# Patient Record
Sex: Female | Born: 1970 | Race: Black or African American | Hispanic: No | Marital: Married
Health system: Southern US, Community
[De-identification: ages and names within clinical notes are randomized; demographics above are authoritative.]

---

## 1999-08-06 ENCOUNTER — Other Ambulatory Visit: Admission: RE | Admit: 1999-08-06 | Discharge: 1999-08-06 | Payer: Self-pay | Admitting: Family Medicine

## 2008-10-05 ENCOUNTER — Ambulatory Visit: Payer: Self-pay | Admitting: Family Medicine

## 2009-12-17 ENCOUNTER — Ambulatory Visit: Payer: Self-pay | Admitting: Family Medicine

## 2010-03-26 ENCOUNTER — Ambulatory Visit: Payer: Self-pay | Admitting: Gastroenterology

## 2010-03-28 LAB — PATHOLOGY REPORT

## 2011-01-23 ENCOUNTER — Ambulatory Visit: Payer: Self-pay | Admitting: Family Medicine

## 2012-03-23 ENCOUNTER — Ambulatory Visit: Payer: Self-pay | Admitting: Family Medicine

## 2013-03-24 ENCOUNTER — Ambulatory Visit: Payer: Self-pay | Admitting: Family Medicine

## 2014-08-22 ENCOUNTER — Ambulatory Visit: Payer: Self-pay | Admitting: Family Medicine

## 2017-06-18 ENCOUNTER — Other Ambulatory Visit: Payer: Self-pay | Admitting: Family Medicine

## 2017-06-18 DIAGNOSIS — Z1231 Encounter for screening mammogram for malignant neoplasm of breast: Secondary | ICD-10-CM

## 2018-05-18 ENCOUNTER — Other Ambulatory Visit: Payer: Self-pay | Admitting: Internal Medicine

## 2018-05-18 DIAGNOSIS — Z1231 Encounter for screening mammogram for malignant neoplasm of breast: Secondary | ICD-10-CM

## 2018-06-02 ENCOUNTER — Inpatient Hospital Stay: Admission: RE | Admit: 2018-06-02 | Payer: Self-pay | Source: Ambulatory Visit

## 2018-06-21 ENCOUNTER — Inpatient Hospital Stay: Admission: RE | Admit: 2018-06-21 | Payer: Self-pay | Source: Ambulatory Visit

## 2018-06-23 ENCOUNTER — Inpatient Hospital Stay: Admission: RE | Admit: 2018-06-23 | Payer: Self-pay | Source: Ambulatory Visit

## 2018-06-24 ENCOUNTER — Ambulatory Visit: Payer: Self-pay

## 2018-06-29 ENCOUNTER — Ambulatory Visit
Admission: RE | Admit: 2018-06-29 | Discharge: 2018-06-29 | Disposition: A | Discharge status: Home / Self Care | Payer: BC Managed Care – PPO | Source: Ambulatory Visit | Attending: Internal Medicine | Admitting: Internal Medicine

## 2018-06-29 ENCOUNTER — Encounter (INDEPENDENT_AMBULATORY_CARE_PROVIDER_SITE_OTHER): Payer: Self-pay

## 2018-06-29 DIAGNOSIS — Z1231 Encounter for screening mammogram for malignant neoplasm of breast: Secondary | ICD-10-CM | POA: Insufficient documentation

## 2019-08-11 ENCOUNTER — Other Ambulatory Visit: Payer: Self-pay | Admitting: Internal Medicine

## 2019-08-11 DIAGNOSIS — Z1231 Encounter for screening mammogram for malignant neoplasm of breast: Secondary | ICD-10-CM

## 2019-08-16 ENCOUNTER — Other Ambulatory Visit: Payer: Self-pay

## 2019-08-16 ENCOUNTER — Ambulatory Visit
Admission: RE | Admit: 2019-08-16 | Discharge: 2019-08-16 | Disposition: A | Discharge status: Home / Self Care | Payer: BC Managed Care – PPO | Source: Ambulatory Visit | Attending: Internal Medicine | Admitting: Internal Medicine

## 2019-08-16 DIAGNOSIS — Z1231 Encounter for screening mammogram for malignant neoplasm of breast: Secondary | ICD-10-CM | POA: Diagnosis present

## 2020-08-06 ENCOUNTER — Other Ambulatory Visit: Payer: Self-pay | Admitting: Internal Medicine

## 2020-08-06 DIAGNOSIS — Z1231 Encounter for screening mammogram for malignant neoplasm of breast: Secondary | ICD-10-CM

## 2020-08-10 ENCOUNTER — Ambulatory Visit: Payer: BC Managed Care – PPO

## 2020-08-16 ENCOUNTER — Other Ambulatory Visit: Payer: Self-pay

## 2020-08-16 ENCOUNTER — Ambulatory Visit
Admission: RE | Admit: 2020-08-16 | Discharge: 2020-08-16 | Disposition: A | Discharge status: Home / Self Care | Payer: BC Managed Care – PPO | Source: Ambulatory Visit | Attending: Internal Medicine | Admitting: Internal Medicine

## 2020-08-16 DIAGNOSIS — Z1231 Encounter for screening mammogram for malignant neoplasm of breast: Secondary | ICD-10-CM

## 2021-07-03 IMAGING — MG DIGITAL SCREENING BILAT W/ TOMO W/ CAD
8 series · 9 of 24 positions shown · non-contrast
Comparison: Previous exam(s).

CLINICAL DATA: Screening.

EXAM:
DIGITAL SCREENING BILATERAL MAMMOGRAM WITH TOMO AND CAD

[R CC synth-2D]
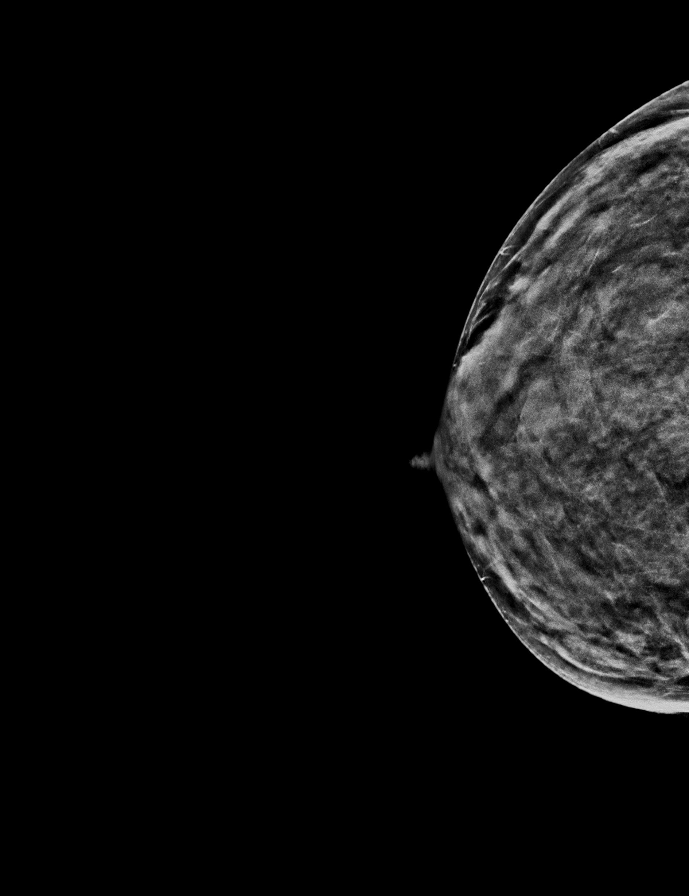

[R MLO synth-2D]
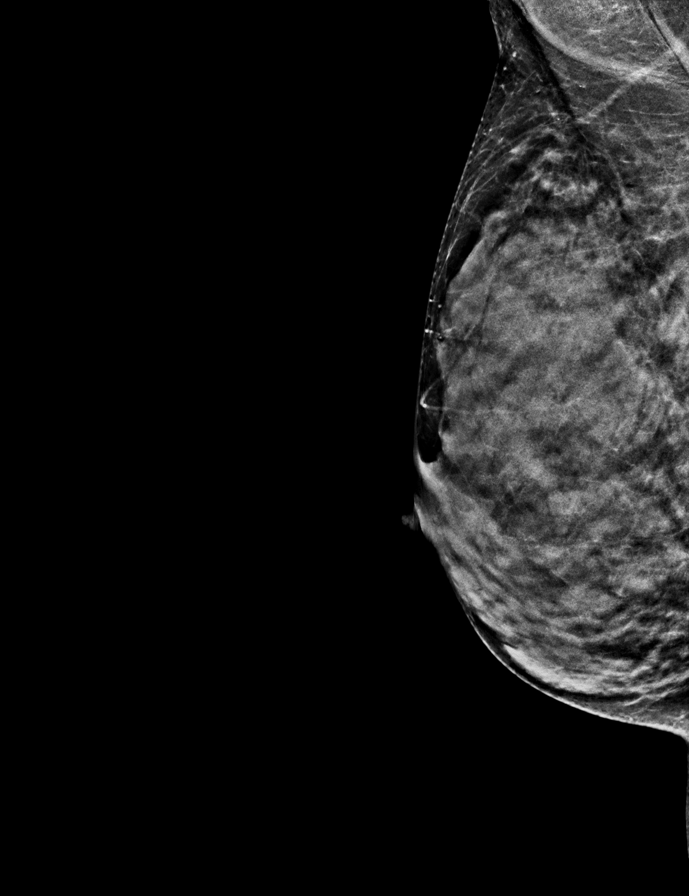

[L CC synth-2D]
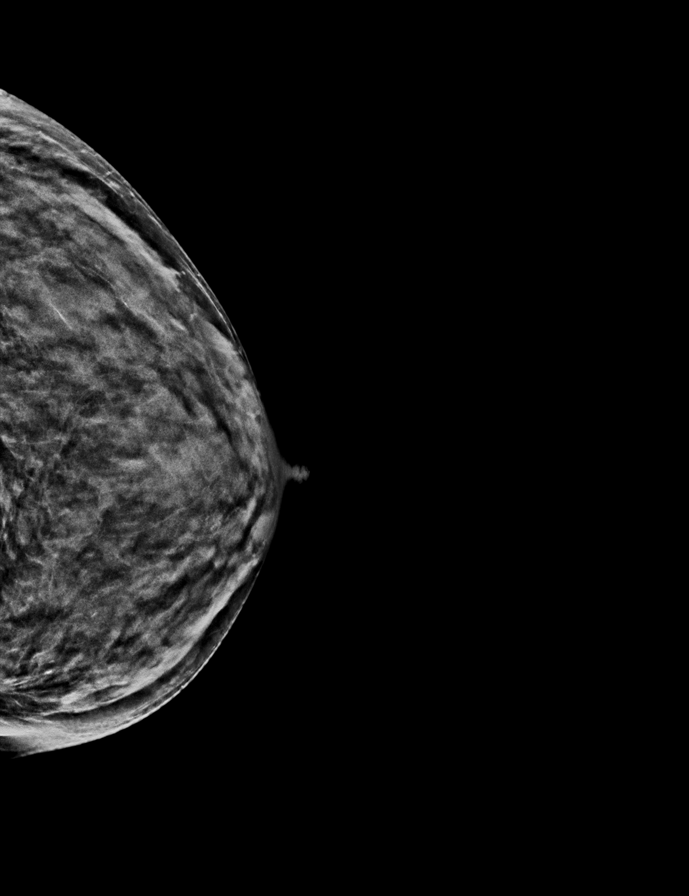

[L MLO synth-2D]
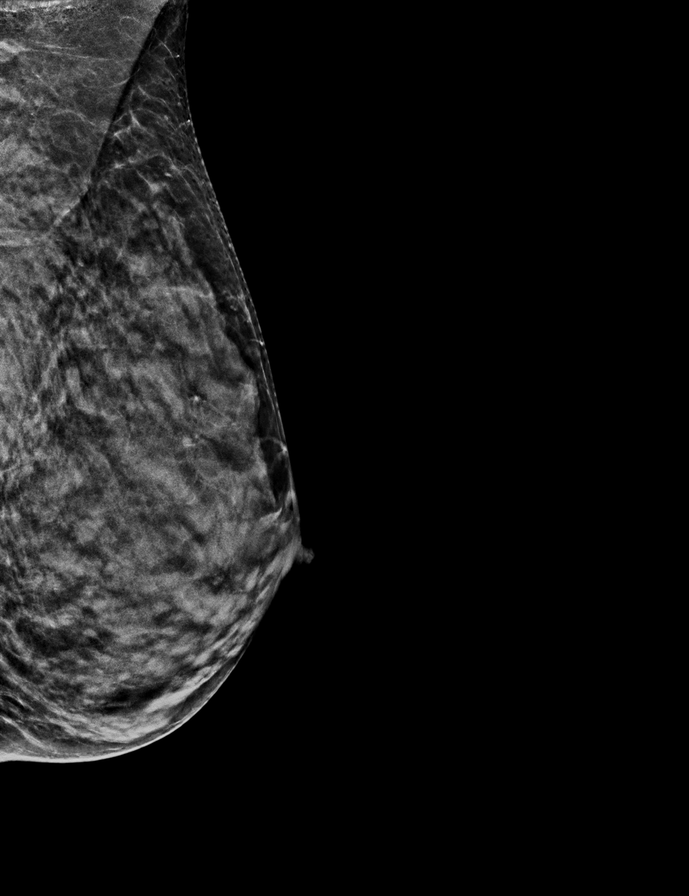

[L CC tomo · 2 of 45 frames shown]
[frame 15/45]
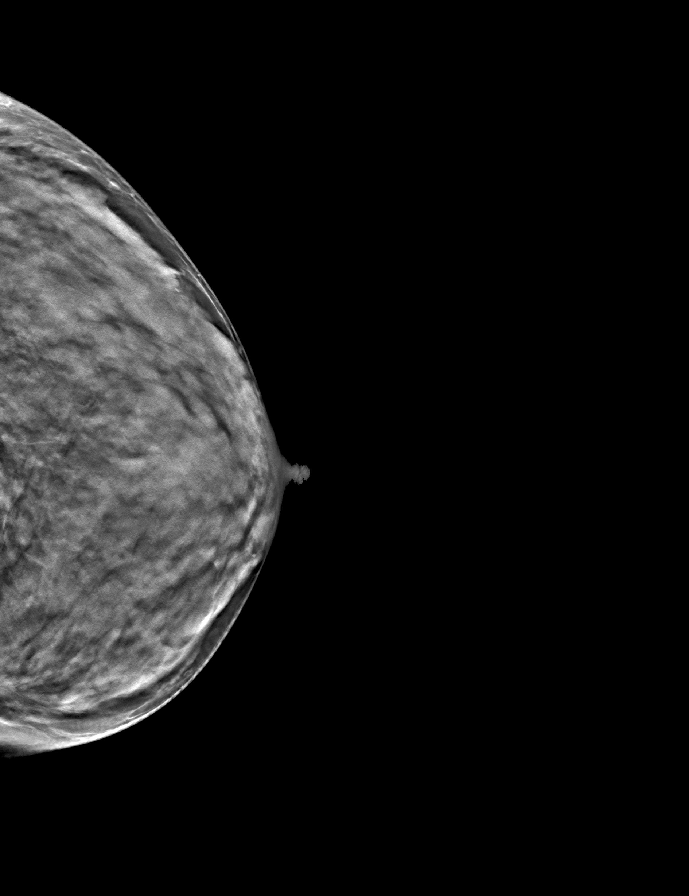
[frame 23/45]
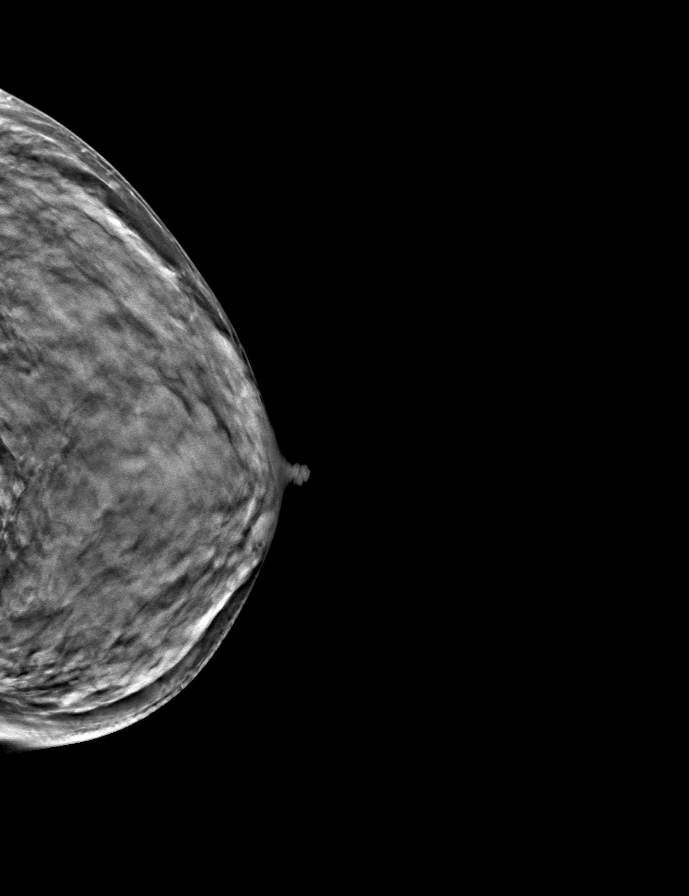

[R CC tomo · tomo slice 23/46.0]
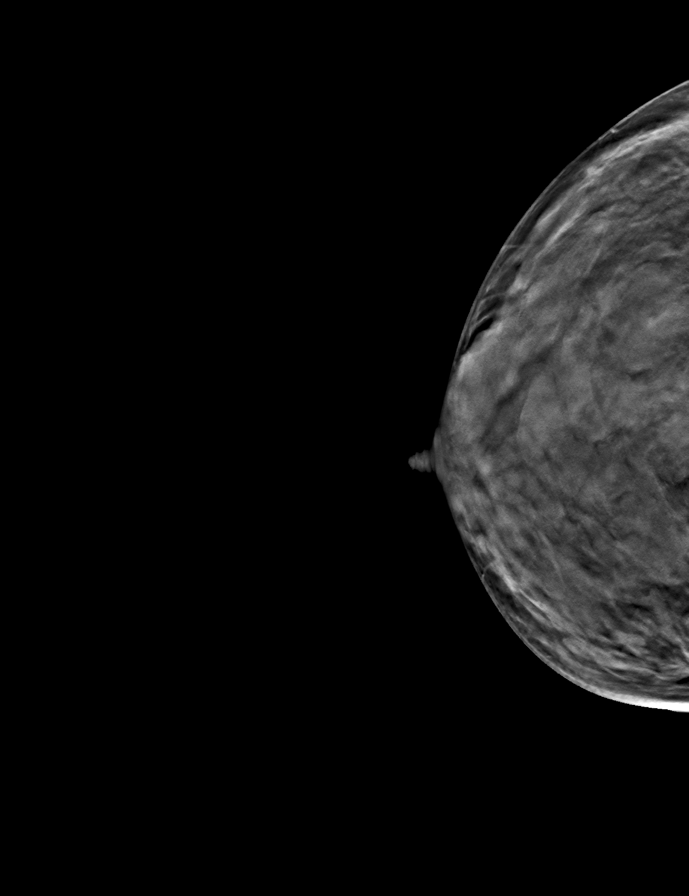

[L MLO tomo · tomo slice 23/46.0]
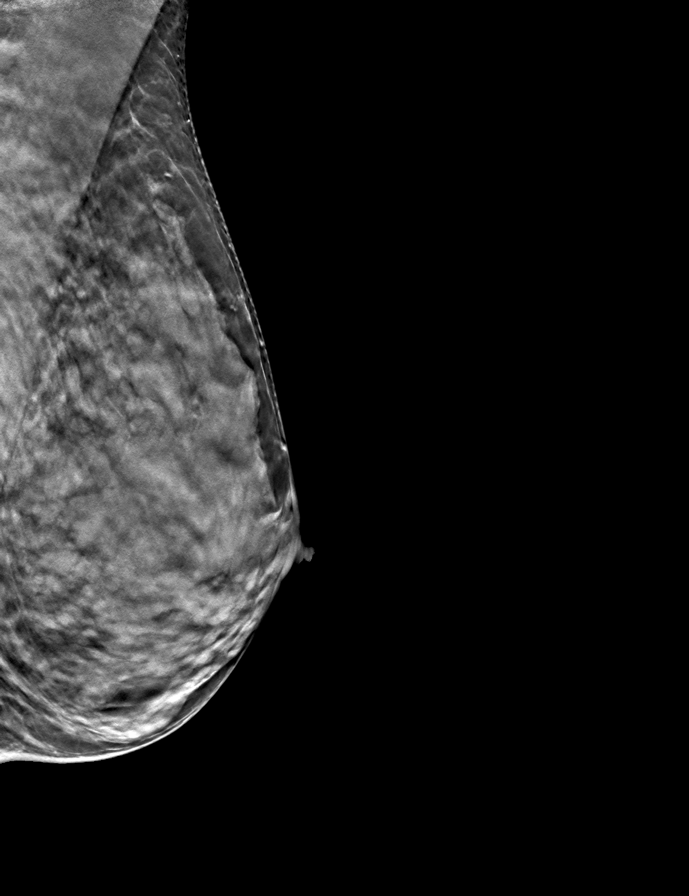

[R MLO tomo · tomo slice 23/46.0]
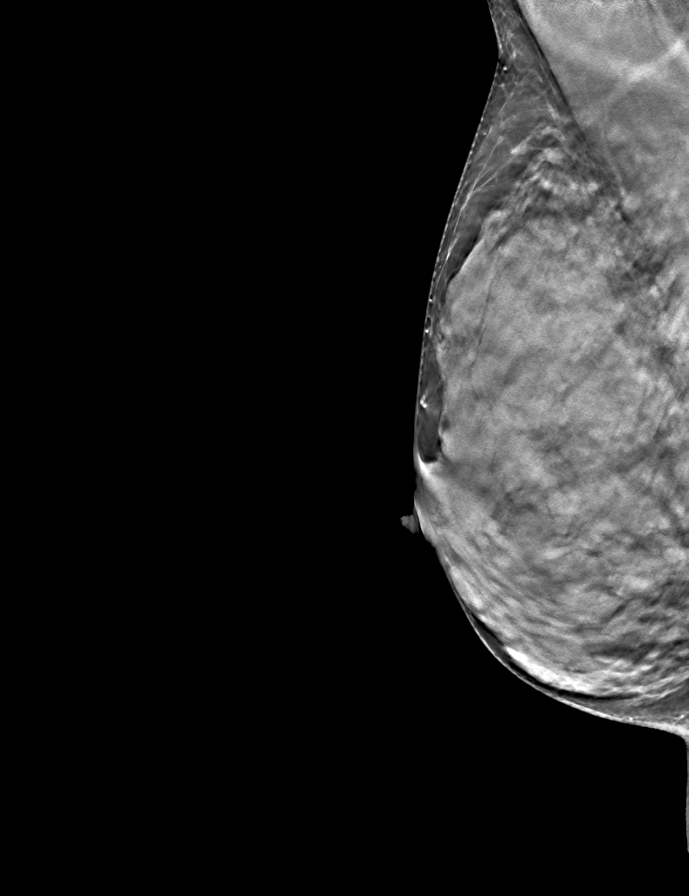

[9 of 24 positions shown; findings below may reference images not displayed]

ACR Breast Density Category d: The breast tissue is extremely dense,
which lowers the sensitivity of mammography
FINDINGS: There are no findings suspicious for malignancy. Images were
processed with CAD.
IMPRESSION: No mammographic evidence of malignancy. A result letter of this
screening mammogram will be mailed directly to the patient.

RECOMMENDATION:
Screening mammogram in one year. (Code:WO-0-ZI0)

BI-RADS CATEGORY  1: Negative.

## 2021-12-23 ENCOUNTER — Other Ambulatory Visit: Payer: Self-pay | Admitting: Nurse Practitioner

## 2021-12-23 DIAGNOSIS — Z1231 Encounter for screening mammogram for malignant neoplasm of breast: Secondary | ICD-10-CM

## 2022-01-22 ENCOUNTER — Ambulatory Visit
Admission: RE | Admit: 2022-01-22 | Discharge: 2022-01-22 | Disposition: A | Discharge status: Home / Self Care | Payer: BC Managed Care – PPO | Source: Ambulatory Visit | Attending: Nurse Practitioner | Admitting: Nurse Practitioner

## 2022-01-22 DIAGNOSIS — Z1231 Encounter for screening mammogram for malignant neoplasm of breast: Secondary | ICD-10-CM

## 2022-02-04 ENCOUNTER — Ambulatory Visit: Payer: BC Managed Care – PPO

## 2022-10-13 ENCOUNTER — Ambulatory Visit
Admission: RE | Admit: 2022-10-13 | Discharge: 2022-10-13 | Disposition: A | Discharge status: Home / Self Care | Payer: BC Managed Care – PPO | Attending: Gastroenterology | Admitting: Gastroenterology

## 2022-10-13 ENCOUNTER — Ambulatory Visit: Payer: BC Managed Care – PPO | Admitting: Anesthesiology

## 2022-10-13 ENCOUNTER — Encounter
Admission: RE | Disposition: A | Discharge status: Home / Self Care | Payer: Self-pay | Source: Home / Self Care | Attending: Gastroenterology

## 2022-10-13 DIAGNOSIS — K641 Second degree hemorrhoids: Secondary | ICD-10-CM | POA: Diagnosis not present

## 2022-10-13 DIAGNOSIS — Z800 Family history of malignant neoplasm of digestive organs: Secondary | ICD-10-CM | POA: Insufficient documentation

## 2022-10-13 DIAGNOSIS — G40909 Epilepsy, unspecified, not intractable, without status epilepticus: Secondary | ICD-10-CM | POA: Insufficient documentation

## 2022-10-13 DIAGNOSIS — Z1211 Encounter for screening for malignant neoplasm of colon: Secondary | ICD-10-CM | POA: Insufficient documentation

## 2022-10-13 HISTORY — PX: COLONOSCOPY WITH PROPOFOL: SHX5780

## 2022-10-13 SURGERY — COLONOSCOPY WITH PROPOFOL
Anesthesia: General

## 2022-10-13 MED ORDER — LIDOCAINE HCL (CARDIAC) PF 100 MG/5ML IV SOSY
PREFILLED_SYRINGE | INTRAVENOUS | Status: DC | PRN
  Administered 2022-10-13: 20 mg via INTRAVENOUS

## 2022-10-13 MED ORDER — PROPOFOL 10 MG/ML IV BOLUS
INTRAVENOUS | Status: DC | PRN
  Administered 2022-10-13: 20 mg via INTRAVENOUS
  Administered 2022-10-13: 60 mg via INTRAVENOUS

## 2022-10-13 MED ORDER — SODIUM CHLORIDE 0.9 % IV SOLN
INTRAVENOUS | Status: DC
  Administered 2022-10-13: 20 mL/h via INTRAVENOUS

## 2022-10-13 MED ORDER — PROPOFOL 500 MG/50ML IV EMUL
INTRAVENOUS | Status: DC | PRN
  Administered 2022-10-13: 125 ug/kg/min via INTRAVENOUS

## 2022-10-13 MED ORDER — STERILE WATER FOR IRRIGATION IR SOLN
Status: DC | PRN
  Administered 2022-10-13: 240 mL

## 2022-10-13 MED ORDER — PROPOFOL 1000 MG/100ML IV EMUL
INTRAVENOUS | Status: AC
  Filled 2022-10-13: qty 100

## 2022-10-13 NOTE — Anesthesia Postprocedure Evaluation (Signed)
Anesthesia Post Note  Patient: Tonya Carlson  Procedure(s) Performed: COLONOSCOPY WITH PROPOFOL  Patient location during evaluation: PACU Anesthesia Type: General Level of consciousness: awake and alert Pain management: pain level controlled Vital Signs Assessment: post-procedure vital signs reviewed and stable Respiratory status: spontaneous breathing, nonlabored ventilation and respiratory function stable Cardiovascular status: blood pressure returned to baseline and stable Postop Assessment: no apparent nausea or vomiting Anesthetic complications: no   No notable events documented.   Last Vitals:  Vitals:   10/13/22 1120 10/13/22 1132  BP: (!) 90/57 109/75  Pulse:  68  Resp:  (!) 23  Temp: 36.6 C   SpO2:  92%    Last Pain:  Vitals:   10/13/22 1132  TempSrc:   PainSc: 0-No pain                 [place] Ouch

## 2022-10-13 NOTE — Interval H&P Note (Signed)
History and Physical Interval Note:  10/13/2022 10:51 AM  Tonya Carlson  has presented today for surgery, with the diagnosis of FAMILY HX OF COLON CANCER.  The various methods of treatment have been discussed with the patient and family. After consideration of risks, benefits and other options for treatment, the patient has consented to  Procedure(s): COLONOSCOPY WITH PROPOFOL (N/A) as a surgical intervention.  The patient's history has been reviewed, patient examined, no change in status, stable for surgery.  I have reviewed the patient's chart and labs.  Questions were answered to the patient's satisfaction.     Lesly Rubenstein  Ok to proceed with colonoscopy

## 2022-10-13 NOTE — Op Note (Signed)
[facility] Patient Name: Tonya Carlson Procedure Date: 10/13/2022 10:42 AM MRN: [id number] Account #: [id number] Date of Birth: 05-06-1971 Admit Type: Outpatient Age: 52 Room: [facility] ENDO ROOM 3 Gender: Female Note Status: Finalized Instrument Name: Peds Colonoscope [id number] Procedure:             Colonoscopy Indications:           Screening in patient at increased risk: Family history                         of 1st-degree relative with colorectal cancer before                         age 75 years Providers:             Andrey Farmer MD, MD Referring MD:          Christena Flake. Raechel Ache, MD (Referring MD) Medicines:             Monitored Anesthesia Care Complications:         No immediate complications. Procedure:             Pre-Anesthesia Assessment:                        - Prior to the procedure, a History and Physical was                         performed, and patient medications and allergies were                         reviewed. The patient is competent. The risks and                         benefits of the procedure and the sedation options and                         risks were discussed with the patient. All questions                         were answered and informed consent was obtained.                         Patient identification and proposed procedure were                         verified by the physician, the nurse, the                         anesthesiologist, the anesthetist and the technician                         in the endoscopy suite. Mental Status Examination:                         alert and oriented. Airway Examination: normal                         oropharyngeal airway and neck mobility. Respiratory  Examination: clear to auscultation. CV Examination:                         normal. Prophylactic Antibiotics: The patient does not                         require prophylactic antibiotics. Prior                          Anticoagulants: The patient has taken no anticoagulant                         or antiplatelet agents. ASA Grade Assessment: II - A                         patient with mild systemic disease. After reviewing                         the risks and benefits, the patient was deemed in                         satisfactory condition to undergo the procedure. The                         anesthesia plan was to use monitored anesthesia care                         (MAC). Immediately prior to administration of                         medications, the patient was re-assessed for adequacy                         to receive sedatives. The heart rate, respiratory                         rate, oxygen saturations, blood pressure, adequacy of                         pulmonary ventilation, and response to care were                         monitored throughout the procedure. The physical                         status of the patient was re-assessed after the                         procedure.                        After obtaining informed consent, the colonoscope was                         passed under direct vision. Throughout the procedure,                         the patient's blood pressure, pulse, and oxygen  saturations were monitored continuously. The                         Colonoscope was introduced through the anus and                         advanced to the the terminal ileum. The colonoscopy                         was performed without difficulty. The patient                         tolerated the procedure well. The quality of the bowel                         preparation was good. The terminal ileum, ileocecal                         valve, appendiceal orifice, and rectum were                         photographed. Findings:      The perianal and digital rectal examinations were normal.      The terminal ileum appeared normal.      Internal hemorrhoids  were found during retroflexion. The hemorrhoids       were Grade II (internal hemorrhoids that prolapse but reduce       spontaneously).      The exam was otherwise without abnormality on direct and retroflexion       views. Impression:            - The examined portion of the ileum was normal.                        - Internal hemorrhoids.                        - The examination was otherwise normal on direct and                         retroflexion views.                        - No specimens collected. Recommendation:        - Discharge patient to home.                        - Resume previous diet.                        - Continue present medications.                        - Repeat colonoscopy in 5 years for screening purposes.                        - Return to referring physician as previously                         scheduled. Procedure Code(s):     --- Professional ---  G0105, Colorectal cancer screening; colonoscopy on                         individual at high risk Diagnosis Code(s):     --- Professional ---                        Z80.0, Family history of malignant neoplasm of                         digestive organs                        K64.1, Second degree hemorrhoids CPT copyright 2022 [employer]. All rights reserved. The codes documented in this report are preliminary and upon coder review may  be revised to meet current compliance requirements. Andrey Farmer MD, MD 10/13/2022 11:23:46 AM Number of Addenda: 0 Note Initiated On: 10/13/2022 10:42 AM Scope Withdrawal Time: 0 hours 7 minutes 51 seconds  Total Procedure Duration: 0 hours 15 minutes 57 seconds  Estimated Blood Loss:  Estimated blood loss: none.      [facility]

## 2022-10-13 NOTE — Transfer of Care (Signed)
Immediate Anesthesia Transfer of Care Note  Patient: Tonya Carlson  Procedure(s) Performed: COLONOSCOPY WITH PROPOFOL  Patient Location: PACU  Anesthesia Type:General  Level of Consciousness: drowsy  Airway & Oxygen Therapy: Patient Spontanous Breathing  Post-op Assessment: Report given to RN and Post -op Vital signs reviewed and stable  Post vital signs: Reviewed and stable  Last Vitals:  Vitals Value Taken Time  BP 90/57 10/13/22 1120  Temp 97.5   Pulse 65 10/13/22 1121  Resp 18 10/13/22 1121  SpO2 100 % 10/13/22 1121  Vitals shown include unvalidated device data.  Last Pain:  Vitals:   10/13/22 1120  TempSrc: (P) Temporal  PainSc:          Complications: No notable events documented.

## 2022-10-13 NOTE — Anesthesia Preprocedure Evaluation (Addendum)
Anesthesia Evaluation  Patient identified by MRN, date of birth, ID band Patient awake    Reviewed: Allergy & Precautions, H&P , NPO status , Patient's Chart, lab work & pertinent test results  Airway Mallampati: III  TM Distance: >3 FB Neck ROM: full    Dental no notable dental hx.    Pulmonary neg pulmonary ROS   Pulmonary exam normal        Cardiovascular negative cardio ROS Normal cardiovascular exam     Neuro/Psych Seizures - (seizure last monday. will followup with nuerologist. She states they are "stress related per neurologist."), Poorly Controlled,  PSYCHIATRIC DISORDERS         GI/Hepatic negative GI ROS, Neg liver ROS,,,  Endo/Other  negative endocrine ROS    Renal/GU negative Renal ROS  negative genitourinary   Musculoskeletal   Abdominal Normal abdominal exam  (+)   Peds  Hematology negative hematology ROS (+)   Anesthesia Other Findings      Reproductive/Obstetrics negative OB ROS                             Anesthesia Physical Anesthesia Plan  ASA: 2  Anesthesia Plan: General   Post-op Pain Management:    Induction: Intravenous  PONV Risk Score and Plan: Propofol infusion and TIVA  Airway Management Planned: Natural Airway  Additional Equipment:   Intra-op Plan:   Post-operative Plan:   Informed Consent: I have reviewed the patients History and Physical, chart, labs and discussed the procedure including the risks, benefits and alternatives for the proposed anesthesia with the patient or authorized representative who has indicated his/her understanding and acceptance.     Dental Advisory Given  Plan Discussed with: CRNA and Surgeon  Anesthesia Plan Comments:         Anesthesia Quick Evaluation

## 2022-10-13 NOTE — H&P (Signed)
Outpatient short stay form Pre-procedure 10/13/2022  Lesly Rubenstein, MD  Primary Physician: Ezequiel Kayser, MD  Reason for visit:  High-risk screening  History of present illness:    52 y/o lady with history of seizure disorder here for screening colonoscopy, mother had colon cancer < 74 y/o. No blood thinners. No significant abdominal surgeries. Last colonoscopy in 2017.    Current Facility-Administered Medications:    0.9 %  sodium chloride infusion, , Intravenous, Continuous, [place], Hilton Cork, MD, Last Rate: 20 mL/hr at 10/13/22 1019, 20 mL/hr at 10/13/22 1019  Medications Prior to Admission  Medication Sig Dispense Refill Last Dose   acetaminophen (TYLENOL) 325 MG suppository Place 325 mg rectally every 4 (four) hours as needed.   10/12/2022   carbamazepine (TEGRETOL) 200 MG tablet Take 200 mg by mouth 2 (two) times daily.   10/12/2022   cholecalciferol (VITAMIN D3) 10 MCG/ML LIQD oral liquid Take 400 Units by mouth daily.   10/12/2022   citalopram (CELEXA) 20 MG tablet Take 20 mg by mouth daily.   10/12/2022   clonazePAM (KLONOPIN) 0.5 MG tablet Take 0.5 mg by mouth 2 (two) times daily as needed for anxiety.   10/12/2022   ferrous gluconate (FERGON) 324 MG tablet Take 324 mg by mouth daily with breakfast.   10/12/2022   loratadine (CLARITIN) 10 MG tablet Take 10 mg by mouth daily.   10/12/2022   Multiple Vitamins-Minerals (MULTIVITAMIN WITH MINERALS) tablet Take 1 tablet by mouth daily.   10/12/2022     Not on File   History reviewed. No pertinent past medical history.  Review of systems:  Otherwise negative.    Physical Exam  Gen: Alert, oriented. Appears stated age.  HEENT: PERRLA. Lungs: No respiratory distress CV: RRR Abd: soft, benign, no masses Ext: No edema    Planned procedures: Proceed with colonoscopy. The patient understands the nature of the planned procedure, indications, risks, alternatives and potential complications including but not limited to  bleeding, infection, perforation, damage to internal organs and possible oversedation/side effects from anesthesia. The patient agrees and gives consent to proceed.  Please refer to procedure notes for findings, recommendations and patient disposition/instructions.     Lesly Rubenstein, MD [facility]

## 2022-10-14 ENCOUNTER — Encounter: Payer: Self-pay | Admitting: Gastroenterology

## 2023-03-12 ENCOUNTER — Other Ambulatory Visit: Payer: Self-pay | Admitting: Obstetrics and Gynecology

## 2023-03-12 DIAGNOSIS — Z1231 Encounter for screening mammogram for malignant neoplasm of breast: Secondary | ICD-10-CM

## 2023-04-13 ENCOUNTER — Ambulatory Visit
Admission: RE | Admit: 2023-04-13 | Discharge: 2023-04-13 | Disposition: A | Discharge status: Home / Self Care | Payer: BC Managed Care – PPO | Source: Ambulatory Visit | Attending: Obstetrics and Gynecology | Admitting: Obstetrics and Gynecology

## 2023-04-13 DIAGNOSIS — Z1231 Encounter for screening mammogram for malignant neoplasm of breast: Secondary | ICD-10-CM | POA: Diagnosis not present

## 2024-03-17 ENCOUNTER — Other Ambulatory Visit: Payer: Self-pay | Admitting: Nurse Practitioner

## 2024-03-17 DIAGNOSIS — Z1231 Encounter for screening mammogram for malignant neoplasm of breast: Secondary | ICD-10-CM

## 2024-05-19 ENCOUNTER — Ambulatory Visit
Admission: RE | Admit: 2024-05-19 | Discharge: 2024-05-19 | Disposition: A | Discharge status: Home / Self Care | Payer: Self-pay | Source: Ambulatory Visit | Attending: Nurse Practitioner | Admitting: Nurse Practitioner

## 2024-05-19 DIAGNOSIS — Z1231 Encounter for screening mammogram for malignant neoplasm of breast: Secondary | ICD-10-CM | POA: Diagnosis present
